# Patient Record
Sex: Female | Born: 2000
Health system: Southern US, Community
[De-identification: ages and names within clinical notes are randomized; demographics above are authoritative.]

## PROBLEM LIST (undated history)

## (undated) DIAGNOSIS — T4145XA Adverse effect of unspecified anesthetic, initial encounter: Secondary | ICD-10-CM

## (undated) DIAGNOSIS — T8859XA Other complications of anesthesia, initial encounter: Secondary | ICD-10-CM

## (undated) DIAGNOSIS — H905 Unspecified sensorineural hearing loss: Secondary | ICD-10-CM

## (undated) HISTORY — PX: WISDOM TOOTH EXTRACTION: SHX21

---
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
## 2021-03-14 ENCOUNTER — Ambulatory Visit (INDEPENDENT_AMBULATORY_CARE_PROVIDER_SITE_OTHER): Payer: 59 | Admitting: Adult Health

## 2021-03-14 ENCOUNTER — Other Ambulatory Visit: Payer: Self-pay

## 2021-03-14 ENCOUNTER — Encounter: Payer: Self-pay | Admitting: Adult Health

## 2021-03-14 VITALS — BP 110/78 | HR 85 | Temp 97.7°F | Ht 64.25 in | Wt 147.2 lb

## 2021-03-14 DIAGNOSIS — Z1389 Encounter for screening for other disorder: Secondary | ICD-10-CM

## 2021-03-14 DIAGNOSIS — Z0000 Encounter for general adult medical examination without abnormal findings: Secondary | ICD-10-CM | POA: Diagnosis not present

## 2021-03-14 DIAGNOSIS — Z6824 Body mass index (BMI) 24.0-24.9, adult: Secondary | ICD-10-CM

## 2021-03-14 DIAGNOSIS — G43009 Migraine without aura, not intractable, without status migrainosus: Secondary | ICD-10-CM

## 2021-03-14 DIAGNOSIS — Z13228 Encounter for screening for other metabolic disorders: Secondary | ICD-10-CM

## 2021-03-14 DIAGNOSIS — K581 Irritable bowel syndrome with constipation: Secondary | ICD-10-CM

## 2021-03-14 DIAGNOSIS — D509 Iron deficiency anemia, unspecified: Secondary | ICD-10-CM

## 2021-03-14 DIAGNOSIS — E559 Vitamin D deficiency, unspecified: Secondary | ICD-10-CM

## 2021-03-14 DIAGNOSIS — E538 Deficiency of other specified B group vitamins: Secondary | ICD-10-CM

## 2021-03-14 NOTE — Progress Notes (Signed)
Complete Physical  Assessment and Plan:  Roberta Zavala was seen today for annual exam.  Diagnoses and all orders for this visit:  Encounter for routine adult health examination without abnormal findings Health Maintenance- Discussed STD testing, safe sex, alcohol and drug awareness, drinking and driving dangers, wearing a seat belt and general safety measures for young adult.  Migraines Currently well managed with imitrex/zofran - magnesium  Papilledema Resolved per ophth/neuro;  Continue follow up for monitoring as recommended  Medication management -     CBC with Differential/Platelet -     COMPLETE METABOLIC PANEL WITH GFR -     Urinalysis, Routine w reflex microscopic  Screening for thyroid disorder -     TSH  Acne, unspecified acne type Recently improved with HBC; hygiene discussed  Restart topicals -adapalene/clindamycin if needed  Anemia Check CBC, iron panel/ferritin, now on multivitamin with iron  IBS -C Improved on linzess and soluble fiber supplement   Need for influenza vaccine Declines - states will get at pharmacy this year   Orders Placed This Encounter  Procedures   CBC with Differential/Platelet   COMPLETE METABOLIC PANEL WITH GFR   Magnesium   VITAMIN D 25 Hydroxy (Vit-D Deficiency, Fractures)   Iron, TIBC and Ferritin Panel   Urinalysis, Routine w reflex microscopic    Discussed med's effects and SE's. Screening labs and tests as requested with regular follow-up as recommended. Over 40 minutes of exam, counseling, chart review and critical decision making was performed  Future Appointments  Date Time Provider [place]  03/14/2022  3:00 PM Liane Comber, NP [facility] None    HPI  BP 110/78    Pulse 85    Temp 97.7 F (36.5 C)    Ht 5' 4.25" (1.632 m)    Wt 147 lb 3.2 oz (66.8 kg)    SpO2 99%    BMI 25.07 kg/m   This very nice 20 y.o.female presents accompanied by her mother for complete physical.  She has Iron deficiency anemia;  Migraine without aura and without status migrainosus, not intractable; and IBS (irritable colon syndrome) - constipation on their problem list.   Patient reports no complaints at this time.   She is a Administrator, arts at [employer], Roberta Zavala at [employer], [employer], doing well, on Christmas break.   No boyfriend, broke up in 2019, never sexually active.   She is on ortho tricyclen for hormonal acne and has helped significantly; has done differen gel and clindamycin topical which have also helped significantly in the past but hasn't needed.   She was found to have papilledema by optometrist in 10/2018 and was referred to Dr. Wyline Copas, ped neuro; Work-up of MRI/MRV was unremarkable and f/u opthomology visit was reassuring with no papilledema present. He feels her headaches likely represent a mixed picture of tension headaches with less frequent migraine headaches. She has been prescribed zofran and imitrex to take with onset of migraines. She reports has worked well, less migraines, one every 1-2 months.   She had constipation, family history of colon cancer and was referred to [facility] per her preference last year, dx with IBS-C, now on linzess 145 mg daily and doing well.   BMI is Body mass index is 25.07 kg/m., she has been working on diet and exercise, she is vegetarian x 2 year, morning star for protein, beans, nuts.  Admits less active in college, but does walk a lot on campus, has added some AM yoga She drinks water only, drinks 32 oz x 2-3,  she drinks 1 cup of coffee if that.  Snacks a couple times a week.   Wt Readings from Last 3 Encounters:  03/14/21 147 lb 3.2 oz (66.8 kg) (78 %, Z= 0.76)*  10/12/20 154 lb (69.9 kg) (84 %, Z= 1.00)*  03/14/20 145 lb (65.8 kg) (78 %, Z= 0.78)*   * Growth percentiles are based on CDC (Girls, 2-20 Years) data.   Today their BP is BP: 110/78  She does workout. She denies chest pain, shortness of breath, dizziness.  Patient is on Vitamin D supplement,  taking multivitamin   Lab Results  Component Value Date   VD25OH 34 03/04/2019      Lab Results  Component Value Date   WBC 6.7 03/14/2020   HGB 12.3 03/14/2020   HCT 37.5 03/14/2020   MCV 91.5 03/14/2020   PLT 225 03/14/2020   Hx of iron def, taking multivitamin with iron Lab Results  Component Value Date   IRON 36 03/14/2020   TIBC 442 03/14/2020   FERRITIN 6 03/14/2020   She is vegetarian, low end normal B12 last year, was doing drops but not recently  Lab Results  Component Value Date   VITAMINB12 413 03/14/2020      Current Medications:  Current Outpatient Medications on File Prior to Visit  Medication Sig Dispense Refill   ASPIRIN 81 PO Take 81 mg by mouth.     linaclotide (LINZESS) 145 MCG CAPS capsule Take 145 mcg by mouth daily before breakfast.     Multiple Vitamin (MULTIVITAMIN) tablet Take 1 tablet by mouth daily.     Norgestimate-Ethinyl Estradiol Triphasic (TRI-SPRINTEC) 0.18/0.215/0.25 MG-35 MCG tablet Take one tablet daily 84 tablet 1   Ondansetron 4 MG FILM Place 1 film on your tongue at the onset of nausea 10 each 5   SUMAtriptan (IMITREX) 25 MG tablet Take 1 tab with onset of migraine symptoms. May repeat in 2 hours if headache persists or recurs. 10 tablet 0   No current facility-administered medications on file prior to visit.   Health Maintenance:   Immunization History  Administered Date(s) Administered   DTaP 09/07/2001, 11/13/2001, 12/29/2001, 01/06/2003, 07/10/2005   HPV 9-valent 06/09/2019, 08/09/2019, 12/10/2019   Hepatitis A 10/11/2008, 11/07/2009   Hepatitis A, Adult 10/11/2008, 11/07/2009   Hepatitis B May [birth year], 2001, 07/31/2001, 04/05/2002   Hepatitis B, adult 2001/04/04, 07/31/2001, 04/05/2002   HiB (PRP-OMP) 09/07/2001, 11/13/2001, 12/29/2001, 09/23/2002   IPV 09/07/2001, 11/13/2001, 06/25/2002, 07/10/2005   Influenza Inj Mdck Quad With Preservative 03/04/2019, 03/14/2020   Influenza,inj,Quad PF,6+ Mos 01/25/2018    Influenza-Unspecified 01/19/2016, 02/06/2017, 02/02/2018, 03/04/2019   MMR 09/23/2002, 07/10/2005   Meningococcal Conjugate 04/09/2013   PFIZER Comirnaty(Gray Top)Covid-19 Tri-Sucrose Vaccine 05/09/2020, 05/30/2020   Pneumococcal Conjugate-13 12/29/2001   Pneumococcal-Unspecified 09/07/2001, 11/13/2001   Tdap 03/20/2012   Varicella 06/25/2002, 07/10/2005    TD/TDAP: 2013 Influenza: last 2019, declines today states will get at pharmacy  Pneumovax: 2003 Prevnar 13: 2003 HPV vaccines: 3/3, 2021 Covid 19: 2/2, Spring 2022, has had 2 natural infection   LMP: No LMP recorded. Sexually Active: no STD testing decline Pap: start age 59 MGM: -  Vision: Dr. Truman Hayward, last visit 2022, wears contacts/glasses Dental: 2022, goes bianually Derm: ?, Spring 2019  Allergies: No Known Allergies Medical History:  has Iron deficiency anemia; Migraine without aura and without status migrainosus, not intractable; and IBS (irritable colon syndrome) - constipation on their problem list. Surgical History:  She  has a past surgical history that includes Wisdom tooth extraction; Adenoidectomy (N/A, 07/02/2016);  and Tympanoplasty with graft (Right, 07/02/2016). Family History:  Her family history includes Asthma in her paternal grandmother; Colon cancer (age of onset: 2) in her maternal grandfather; Crohn's disease in her mother; Dementia in her maternal grandfather; Diabetes in her maternal grandfather; Fibromyalgia in her paternal grandmother; Heart disease in her paternal grandfather; Hypertension in her maternal grandfather, maternal grandmother, mother, and paternal grandmother; Hypothyroidism in her maternal grandmother; Kidney disease in her maternal grandmother; Melanoma (age of onset: 76) in her paternal grandfather; Peripheral Artery Disease in her maternal grandmother; Stroke (age of onset: 50) in her maternal grandfather. Social History:   reports that she has never smoked. She has never used smokeless  tobacco. She reports current alcohol use. She reports that she does not currently use drugs after having used the following drugs: Marijuana.  Review of Systems: Review of Systems  Constitutional:  Negative for malaise/fatigue and weight loss.  HENT:  Negative for hearing loss and tinnitus.   Eyes:  Negative for blurred vision and double vision.  Respiratory:  Negative for cough, sputum production, shortness of breath and wheezing.   Cardiovascular:  Negative for chest pain, palpitations, orthopnea, claudication, leg swelling and PND.  Gastrointestinal:  Negative for abdominal pain, blood in stool, constipation (improved on linzess), diarrhea, heartburn, melena, nausea and vomiting.  Genitourinary: Negative.   Musculoskeletal:  Negative for joint pain and myalgias.  Skin:  Negative for rash.  Neurological:  Positive for headaches (migraines every few months ). Negative for dizziness, tingling, sensory change and weakness.  Endo/Heme/Allergies:  Positive for environmental allergies (mild). Negative for polydipsia.  Psychiatric/Behavioral: Negative.  Negative for depression, memory loss, substance abuse and suicidal ideas. The patient is not nervous/anxious and does not have insomnia.   All other systems reviewed and are negative.  Physical Exam: Estimated body mass index is 25.07 kg/m as calculated from the following:   Height as of this encounter: 5' 4.25" (1.632 m).   Weight as of this encounter: 147 lb 3.2 oz (66.8 kg). BP 110/78    Pulse 85    Temp 97.7 F (36.5 C)    Ht 5' 4.25" (1.632 m)    Wt 147 lb 3.2 oz (66.8 kg)    SpO2 99%    BMI 25.07 kg/m    General Appearance: Well nourished, in no apparent distress.  Eyes: PERRLA, EOMs, conjunctiva no swelling or erythema Sinuses: No Frontal/maxillary tenderness  ENT/Mouth: Ext aud canals clear, normal light reflex with TMs without erythema, bulging. Good dentition. No erythema, swelling, or exudate on post pharynx. Tonsils not swollen or  erythematous. Hearing normal.  Neck: Supple, thyroid normal. No bruits  Respiratory: Respiratory effort normal, BS equal bilaterally without rales, rhonchi, wheezing or stridor.  Cardio: RRR without murmurs, rubs or gallops. Brisk peripheral pulses without edema.  Chest: symmetric, with normal excursions and percussion.  Breasts: Declines, she reports regular self exams without concern, technique reviewed Abdomen: Soft, nontender, no guarding, rebound, hernias, masses, or organomegaly.  Lymphatics: Non tender without lymphadenopathy.  Genitourinary: Defer Musculoskeletal: Full ROM all peripheral extremities,5/5 strength, and normal gait.  Skin: Warm, dry without rashes, lesions, ecchymosis. Neuro: Cranial nerves intact, reflexes equal bilaterally. Normal muscle tone, no cerebellar symptoms. Sensation intact.  Psych: Awake and oriented X 3, normal affect, Insight and Judgment appropriate.   EKG: defer  Izora Ribas, NP 3:38 PM [facility]

## 2021-03-14 NOTE — Patient Instructions (Signed)
[name] , Thank you for taking time to come for your Annual Wellness Visit. I appreciate your ongoing commitment to your health goals. Please review the following plan we discussed and let me know if I can assist you in the future.   These are the goals we discussed:  Goals      Exercise 150 min/wk Moderate Activity     water intake - 65+ fluid ounces daily        This is a list of the screening recommended for you and due dates:  Health Maintenance  Topic Date Due   Hepatitis C Screening: USPSTF Recommendation to screen - Ages 18-79 yo.  03/14/2021*   HIV Screening  03/14/2021*   COVID-19 Vaccine (3 - Pfizer risk series) 03/30/2021*   Flu Shot  06/29/2021*   Pneumococcal Vaccination (1 - PPSV23 if available, else PCV20) 03/14/2022*   Tetanus Vaccine  03/20/2022   HPV Vaccine  Completed  *Topic was postponed. The date shown is not the original due date.      Know what a healthy weight is for you (roughly BMI <25) and aim to maintain this  Aim for 7+ servings of fruits and vegetables daily  65-80+ fluid ounces of water or unsweet tea for healthy kidneys  Limit to max 1 drink of alcohol per day; avoid smoking/tobacco  Limit animal fats in diet for cholesterol and heart health - choose grass fed whenever available  Avoid highly processed foods, and foods high in saturated/trans fats  Aim for low stress - take time to unwind and care for your mental health  Aim for 150 min of moderate intensity exercise weekly for heart health, and weights twice weekly for bone health  Aim for 7-9 hours of sleep daily     High-Fiber Eating Plan Fiber, also called dietary fiber, is a type of carbohydrate. It is found foods such as fruits, vegetables, whole grains, and beans. A high-fiber diet can have many health benefits. Your health care provider may recommend a high-fiber diet to help: Prevent constipation. Fiber can make your bowel movements more regular. Lower your  cholesterol. Relieve the following conditions: Inflammation of veins in the anus (hemorrhoids). Inflammation of specific areas of the digestive tract (uncomplicated diverticulosis). A problem of the large intestine, also called the colon, that sometimes causes pain and diarrhea (irritable bowel syndrome, or IBS). Prevent overeating as part of a weight-loss plan. Prevent heart disease, type 2 diabetes, and certain cancers. What are tips for following this plan? Reading food labels  Check the nutrition facts label on food products for the amount of dietary fiber. Choose foods that have 5 grams of fiber or more per serving. The goals for recommended daily fiber intake include: Men (age 74 or younger): 34-38 g. Men (over age 89): 28-34 g. Women (age 43 or younger): 25-28 g. Women (over age 40): 22-25 g. Your daily fiber goal is _____________ g. Shopping Choose whole fruits and vegetables instead of processed forms, such as apple juice or applesauce. Choose a wide variety of high-fiber foods such as avocados, lentils, oats, and kidney beans. Read the nutrition facts label of the foods you choose. Be aware of foods with added fiber. These foods often have high sugar and sodium amounts per serving. Cooking Use whole-grain flour for baking and cooking. Cook with brown rice instead of white rice. Meal planning Start the day with a breakfast that is high in fiber, such as a cereal that contains 5 g of fiber or  more per serving. Eat breads and cereals that are made with whole-grain flour instead of refined flour or white flour. Eat brown rice, bulgur wheat, or millet instead of white rice. Use beans in place of meat in soups, salads, and pasta dishes. Be sure that half of the grains you eat each day are whole grains. General information You can get the recommended daily intake of dietary fiber by: Eating a variety of fruits, vegetables, grains, nuts, and beans. Taking a fiber supplement if you  are not able to take in enough fiber in your diet. It is better to get fiber through food than from a supplement. Gradually increase how much fiber you consume. If you increase your intake of dietary fiber too quickly, you may have bloating, cramping, or gas. Drink plenty of water to help you digest fiber. Choose high-fiber snacks, such as berries, raw vegetables, nuts, and popcorn. What foods should I eat? Fruits Berries. Pears. Apples. Oranges. Avocado. Prunes and raisins. Dried figs. Vegetables Sweet potatoes. Spinach. Kale. Artichokes. Cabbage. Broccoli. Cauliflower. Green peas. Carrots. Squash. Grains Whole-grain breads. Multigrain cereal. Oats and oatmeal. Brown rice. Barley. Bulgur wheat. Millet. Quinoa. Bran muffins. Popcorn. Rye wafer crackers. Meats and other proteins Navy beans, kidney beans, and pinto beans. Soybeans. Split peas. Lentils. Nuts and seeds. Dairy Fiber-fortified yogurt. Beverages Fiber-fortified soy milk. Fiber-fortified orange juice. Other foods Fiber bars. The items listed above may not be a complete list of recommended foods and beverages. Contact a dietitian for more information. What foods should I avoid? Fruits Fruit juice. Cooked, strained fruit. Vegetables Fried potatoes. Canned vegetables. Well-cooked vegetables. Grains White bread. Pasta made with refined flour. White rice. Meats and other proteins Fatty cuts of meat. Fried chicken or fried fish. Dairy Milk. Yogurt. Cream cheese. Sour cream. Fats and oils Butters. Beverages Soft drinks. Other foods Cakes and pastries. The items listed above may not be a complete list of foods and beverages to avoid. Talk with your dietitian about what choices are best for you. Summary Fiber is a type of carbohydrate. It is found in foods such as fruits, vegetables, whole grains, and beans. A high-fiber diet has many benefits. It can help to prevent constipation, lower blood cholesterol, aid weight loss, and  reduce your risk of heart disease, diabetes, and certain cancers. Increase your intake of fiber gradually. Increasing fiber too quickly may cause cramping, bloating, and gas. Drink plenty of water while you increase the amount of fiber you consume. The best sources of fiber include whole fruits and vegetables, whole grains, nuts, seeds, and beans. This information is not intended to replace advice given to you by your health care provider. Make sure you discuss any questions you have with your health care provider. Document Revised: 07/22/2019 Document Reviewed: 07/22/2019 Elsevier Patient Education  2022 [employer].

## 2021-03-15 LAB — CBC WITH DIFFERENTIAL/PLATELET
Absolute Monocytes: 660 cells/uL (ref 200–950)
Basophils Absolute: 27 cells/uL (ref 0–200)
Basophils Relative: 0.4 %
Eosinophils Absolute: 102 cells/uL (ref 15–500)
Eosinophils Relative: 1.5 %
HCT: 36.9 % (ref 35.0–45.0)
Hemoglobin: 12.4 g/dL (ref 11.7–15.5)
Lymphs Abs: 2156 cells/uL (ref 850–3900)
MCH: 31.3 pg (ref 27.0–33.0)
MCHC: 33.6 g/dL (ref 32.0–36.0)
MCV: 93.2 fL (ref 80.0–100.0)
MPV: 12.2 fL (ref 7.5–12.5)
Monocytes Relative: 9.7 %
Neutro Abs: 3856 cells/uL (ref 1500–7800)
Neutrophils Relative %: 56.7 %
Platelets: 255 10*3/uL (ref 140–400)
RBC: 3.96 10*6/uL (ref 3.80–5.10)
RDW: 11.7 % (ref 11.0–15.0)
Total Lymphocyte: 31.7 %
WBC: 6.8 10*3/uL (ref 3.8–10.8)

## 2021-03-15 LAB — COMPLETE METABOLIC PANEL WITH GFR
AG Ratio: 1.3 (calc) (ref 1.0–2.5)
ALT: 10 U/L (ref 5–32)
AST: 13 U/L (ref 12–32)
Albumin: 4.5 g/dL (ref 3.6–5.1)
Alkaline phosphatase (APISO): 41 U/L (ref 36–128)
BUN: 7 mg/dL (ref 7–20)
CO2: 26 mmol/L (ref 20–32)
Calcium: 9.9 mg/dL (ref 8.9–10.4)
Chloride: 105 mmol/L (ref 98–110)
Creat: 0.76 mg/dL (ref 0.50–0.96)
Globulin: 3.4 g/dL (calc) (ref 2.0–3.8)
Glucose, Bld: 75 mg/dL (ref 65–99)
Potassium: 4.6 mmol/L (ref 3.8–5.1)
Sodium: 140 mmol/L (ref 135–146)
Total Bilirubin: 0.3 mg/dL (ref 0.2–1.1)
Total Protein: 7.9 g/dL (ref 6.3–8.2)
eGFR: 116 mL/min/{1.73_m2} (ref >=60)

## 2021-03-15 LAB — URINALYSIS, ROUTINE W REFLEX MICROSCOPIC
Bacteria, UA: NONE SEEN /HPF
Bilirubin Urine: NEGATIVE
Glucose, UA: NEGATIVE
Hgb urine dipstick: NEGATIVE
Hyaline Cast: NONE SEEN /LPF
Ketones, ur: NEGATIVE
Nitrite: NEGATIVE
Protein, ur: NEGATIVE
RBC / HPF: NONE SEEN /HPF (ref 0–2)
Specific Gravity, Urine: 1.006 (ref 1.001–1.035)
Squamous Epithelial / HPF: NONE SEEN /HPF (ref <=5)
pH: 6 (ref 5.0–8.0)

## 2021-03-15 LAB — IRON,TIBC AND FERRITIN PANEL
%SAT: 15 % (calc) (ref 15–45)
Ferritin: 7 ng/mL — ABNORMAL LOW (ref 16–154)
Iron: 85 ug/dL (ref 27–164)
TIBC: 555 mcg/dL (calc) — ABNORMAL HIGH (ref 271–448)

## 2021-03-15 LAB — VITAMIN D 25 HYDROXY (VIT D DEFICIENCY, FRACTURES): Vit D, 25-Hydroxy: 44 ng/mL (ref 30–100)

## 2021-03-15 LAB — MAGNESIUM: Magnesium: 2.1 mg/dL (ref 1.5–2.5)

## 2021-03-21 ENCOUNTER — Other Ambulatory Visit: Payer: Self-pay

## 2021-03-21 DIAGNOSIS — G43009 Migraine without aura, not intractable, without status migrainosus: Secondary | ICD-10-CM

## 2021-03-21 MED ORDER — SUMATRIPTAN SUCCINATE 25 MG PO TABS: ORAL_TABLET | ORAL | 0 refills | Status: DC

## 2021-04-12 ENCOUNTER — Other Ambulatory Visit: Payer: Self-pay | Admitting: Adult Health

## 2021-04-12 DIAGNOSIS — Z30011 Encounter for initial prescription of contraceptive pills: Secondary | ICD-10-CM

## 2021-04-12 DIAGNOSIS — L709 Acne, unspecified: Secondary | ICD-10-CM

## 2021-10-10 ENCOUNTER — Telehealth: Payer: Self-pay | Admitting: Adult Health

## 2021-10-10 DIAGNOSIS — R112 Nausea with vomiting, unspecified: Secondary | ICD-10-CM

## 2021-10-10 DIAGNOSIS — G43009 Migraine without aura, not intractable, without status migrainosus: Secondary | ICD-10-CM

## 2021-10-10 NOTE — Telephone Encounter (Signed)
Pt is requesting a refill on Sumatriptin and Ondansetron to go to [facility] in [place]

## 2021-10-11 ENCOUNTER — Other Ambulatory Visit: Payer: Self-pay

## 2021-10-11 DIAGNOSIS — R112 Nausea with vomiting, unspecified: Secondary | ICD-10-CM

## 2021-10-11 MED ORDER — ONDANSETRON 4 MG PO FILM: ORAL_FILM | ORAL | 5 refills | Status: DC

## 2021-10-11 MED ORDER — ONDANSETRON HCL 4 MG PO TABS: 4.0000 mg | ORAL_TABLET | Freq: Every day | ORAL | 1 refills | Status: AC | PRN

## 2021-10-11 MED ORDER — SUMATRIPTAN SUCCINATE 25 MG PO TABS: ORAL_TABLET | ORAL | 0 refills | Status: AC

## 2021-10-11 NOTE — Addendum Note (Signed)
Addended by: Dionicio Stall on: 10/11/2021 08:46 AM   Modules accepted: Orders

## 2021-10-25 ENCOUNTER — Ambulatory Visit (INDEPENDENT_AMBULATORY_CARE_PROVIDER_SITE_OTHER): Payer: 59 | Admitting: Nurse Practitioner

## 2021-10-25 VITALS — BP 110/76 | HR 80 | Temp 97.5°F | Ht 65.0 in | Wt 145.0 lb

## 2021-10-25 DIAGNOSIS — F410 Panic disorder [episodic paroxysmal anxiety] without agoraphobia: Secondary | ICD-10-CM

## 2021-10-25 DIAGNOSIS — F4389 Other reactions to severe stress: Secondary | ICD-10-CM | POA: Diagnosis not present

## 2021-10-25 DIAGNOSIS — Z79899 Other long term (current) drug therapy: Secondary | ICD-10-CM | POA: Diagnosis not present

## 2021-10-25 DIAGNOSIS — F411 Generalized anxiety disorder: Secondary | ICD-10-CM | POA: Diagnosis not present

## 2021-10-25 NOTE — Progress Notes (Signed)
Assessment and Plan:  Roberta Zavala was seen today for an episodic visit.  Diagnoses and all order for this visit:  1. Generalized anxiety disorder Start Propranolol as needed. Reviewed relaxation techniques.  Sleep hygiene. Recommended Cognitive Behavioral Therapy (CBT)-continue to seek out. Recommended mindfulness meditation and exercise.   Insight-oriented psychotherapy given for 16 minutes exclusively. Psychoeducation:  encouraged personality growth wand development through coping techniques and problem-solving skills. Limit/Decrease/Monitor drug/alcohol intake.    2. Panic Propranolol as needed. Declines further medication intervention at this time Journal, note panic/increase in anxiety during menstruation/hormonal fluctuation. Stay well hydrated. Continue to monitor  3. Other reactions to severe stress Discussed grounding. Talk with friends and family  4. Medication management All medications discussed and reviewed in full. All questions and concerns regarding medications addressed.    Notify office for further evaluation and treatment, questions or concerns if s/s fail to improve. The risks and benefits of my recommendations, as well as other treatment options were discussed with the patient today. Questions were answered.  Further disposition pending results of labs. Discussed med's effects and SE's.    Over 20 minutes of exam, counseling, chart review, and critical decision making was performed.   Future Appointments  Date Time Provider Department Center  03/14/2022  3:00 PM Adela Glimpse, NP [facility] None    ------------------------------------------------------------------------------------------------------------------   HPI BP 110/76   Pulse 80   Temp (!) 97.5 F (36.4 C)   Ht 5\' 5"  (1.651 m)   Wt 145 lb (65.8 kg)   SpO2 99%   BMI 24.13 kg/m    Roberta Zavala is a 21 y.o. female who presents for follow up of anxiety disorder and panic attacks. She  has the following anxiety symptoms: chest pain, difficulty concentrating, feelings of losing control, panic attacks, and racing thoughts. Onset of symptoms was approximately 1 week ago. Symptoms have been gradually worsening since that time. She denies current suicidal and homicidal ideation. Family history significant for anxiety and depression. Risk factors:  increase stress in school, stress in the home . Previous treatment includes  Hydroxyzine.  She has seen a counselor in the past, in school but no longer continues. . She complains of the following medication side effects: drowsiness.   Past Medical History:  Diagnosis Date   Complication of anesthesia    mother states pt. needed more anesthesia than usual for someone her age with wisdom teeth extraction   Papilledema of both eyes 11/25/2018   Sensorineural hearing loss of right ear 05/2016     No Known Allergies  Current Outpatient Medications on File Prior to Visit  Medication Sig   hydrOXYzine (VISTARIL) 25 MG capsule Take 25 mg by mouth as needed.   linaclotide (LINZESS) 145 MCG CAPS capsule Take 145 mcg by mouth daily before breakfast.   Multiple Vitamin (MULTIVITAMIN) tablet Take 1 tablet by mouth daily.   Norgestimate-Ethinyl Estradiol Triphasic (TRI-SPRINTEC) 0.18/0.215/0.25 MG-35 MCG tablet Take 1 tablet by mouth once daily   ondansetron (ZOFRAN) 4 MG tablet Take 1 tablet (4 mg total) by mouth daily as needed for nausea or vomiting.   SUMAtriptan (IMITREX) 25 MG tablet Take 1 tab with onset of migraine symptoms. May repeat in 2 hours if headache persists or recurs.   ASPIRIN 81 PO Take 81 mg by mouth. (Patient not taking: Reported on 10/25/2021)   No current facility-administered medications on file prior to visit.    ROS: all negative except what is noted in the HPI.     Physical Exam:  BP 110/76   Pulse 80   Temp (!) 97.5 F (36.4 C)   Ht 5\' 5"  (1.651 m)   Wt 145 lb (65.8 kg)   SpO2 99%   BMI 24.13 kg/m    General Appearance: NAD.  Awake, conversant and cooperative. Eyes: PERRLA, EOMs intact.  Sclera white.  Conjunctiva without erythema. Sinuses: No frontal/maxillary tenderness.  No nasal discharge. Nares patent.  ENT/Mouth: Ext aud canals clear.  Bilateral TMs w/DOL and without erythema or bulging. Hearing intact.  Posterior pharynx without swelling or exudate.  Tonsils without swelling or erythema.  Neck: Supple.  No masses, nodules or thyromegaly. Respiratory: Effort is regular with non-labored breathing. Breath sounds are equal bilaterally without rales, rhonchi, wheezing or stridor.  Cardio: RRR with no MRGs. Brisk peripheral pulses without edema.  Abdomen: Active BS in all four quadrants.  Soft and non-tender without guarding, rebound tenderness, hernias or masses. Lymphatics: Non tender without lymphadenopathy.  Musculoskeletal: Full ROM, 5/5 strength, normal ambulation.  No clubbing or cyanosis. Skin: Appropriate color for ethnicity. Warm without rashes, lesions, ecchymosis, ulcers.  Neuro: CN II-XII grossly normal. Normal muscle tone without cerebellar symptoms and intact sensation.   Psych: AO X 3,  appropriate mood and affect, insight and judgment.     , NP 3:59 PM [facility]

## 2021-10-26 ENCOUNTER — Encounter: Payer: Self-pay | Admitting: Nurse Practitioner

## 2021-10-29 MED ORDER — PROPRANOLOL HCL 10 MG PO TABS: ORAL_TABLET | ORAL | 2 refills | Status: AC

## 2022-02-20 ENCOUNTER — Encounter: Payer: Self-pay | Admitting: Nurse Practitioner

## 2022-02-20 ENCOUNTER — Ambulatory Visit (INDEPENDENT_AMBULATORY_CARE_PROVIDER_SITE_OTHER): Payer: 59 | Admitting: Nurse Practitioner

## 2022-02-20 VITALS — BP 120/70 | HR 104 | Temp 98.3°F | Resp 16 | Ht 65.0 in | Wt 150.0 lb

## 2022-02-20 DIAGNOSIS — F410 Panic disorder [episodic paroxysmal anxiety] without agoraphobia: Secondary | ICD-10-CM | POA: Diagnosis not present

## 2022-02-20 DIAGNOSIS — F411 Generalized anxiety disorder: Secondary | ICD-10-CM | POA: Diagnosis not present

## 2022-02-20 DIAGNOSIS — F4389 Other reactions to severe stress: Secondary | ICD-10-CM | POA: Diagnosis not present

## 2022-02-20 DIAGNOSIS — Z79899 Other long term (current) drug therapy: Secondary | ICD-10-CM

## 2022-02-20 NOTE — Progress Notes (Signed)
Assessment and Plan:  Roberta Zavala was seen today for an episodic visit.  Diagnoses and all order for this visit:  1. Generalized anxiety disorder Continue Propranolol as needed. Reviewed relaxation techniques.  Sleep hygiene. Recommended Cognitive Behavioral Therapy (CBT)-continue to seek out. Recommended mindfulness meditation and exercise.   Insight-oriented psychotherapy given for 16 minutes exclusively. Psychoeducation:  encouraged personality growth wand development through coping techniques and problem-solving skills. Limit/Decrease/Monitor drug/alcohol intake.    2.  Panic Continue Propranolol as needed. Declines further medication intervention at this time Journal, note panic/increase in anxiety during menstruation/hormonal fluctuation. Stay well hydrated. Continue to monitor  3. Other reactions to severe stress Improved. Discussed grounding. Talk with friends and family  4. Medication management All medications discussed and reviewed in full. All questions and concerns regarding medications addressed.    Notify office for further evaluation and treatment, questions or concerns if s/s fail to improve. The risks and benefits of my recommendations, as well as other treatment options were discussed with the patient today. Questions were answered.  Further disposition pending results of labs. Discussed med's effects and SE's.    Over 20 minutes of exam, counseling, chart review, and critical decision making was performed.   Future Appointments  Date Time Provider [place]  03/14/2022  3:00 PM Amarie Tarte, Kenney Houseman, NP [facility] None    ------------------------------------------------------------------------------------------------------------------   HPI BP 120/70   Pulse (!) 104   Temp 98.3 F (36.8 C)   Resp 16   Ht 5\' 5"  (1.651 m)   Wt 150 lb (68 kg)   SpO2 99%   BMI 24.96 kg/m    Roberta Zavala is a 21 y.o. female who presents for follow up of anxiety  disorder and panic attacks. She has the following anxiety symptoms: chest pain, difficulty concentrating, feelings of losing control, panic attacks, and racing thoughts. She was treated with Propranolol and Hydroxyzine.  She has not needed the Propranolol and feels better taking the Hydroxyzine at night before bed when feeling anxious.  States that staying busy with school has helped to ease her mind.  She denies current suicidal and homicidal ideation. Family history significant for anxiety and depression. Risk factors:  increase stress in school, stress in the home . Previous treatment includes  Hydroxyzine.  She has seen a counselor in the past, in school but no longer continues. . She complains of the following medication side effects: drowsiness.   Past Medical History:  Diagnosis Date   Complication of anesthesia    mother states pt. needed more anesthesia than usual for someone her age with wisdom teeth extraction   Papilledema of both eyes 11/25/2018   Sensorineural hearing loss of right ear 05/2016     No Known Allergies  Current Outpatient Medications on File Prior to Visit  Medication Sig   hydrOXYzine (VISTARIL) 25 MG capsule Take 25 mg by mouth as needed.   linaclotide (LINZESS) 145 MCG CAPS capsule Take 145 mcg by mouth daily before breakfast.   Multiple Vitamin (MULTIVITAMIN) tablet Take 1 tablet by mouth daily.   Norgestimate-Ethinyl Estradiol Triphasic (TRI-SPRINTEC) 0.18/0.215/0.25 MG-35 MCG tablet Take 1 tablet by mouth once daily   ondansetron (ZOFRAN) 4 MG tablet Take 1 tablet (4 mg total) by mouth daily as needed for nausea or vomiting.   propranolol (INDERAL) 10 MG tablet Take 1 - 2 tablets (10 - 20 mg) 30 minutes prior to an anxiety provoking event.   SUMAtriptan (IMITREX) 25 MG tablet Take 1 tab with onset of migraine symptoms.  May repeat in 2 hours if headache persists or recurs.   ASPIRIN 81 PO Take 81 mg by mouth. (Patient not taking: Reported on 10/25/2021)   No  current facility-administered medications on file prior to visit.    ROS: all negative except what is noted in the HPI.     Physical Exam:  BP 120/70   Pulse (!) 104   Temp 98.3 F (36.8 C)   Resp 16   Ht 5\' 5"  (1.651 m)   Wt 150 lb (68 kg)   SpO2 99%   BMI 24.96 kg/m   General Appearance: NAD.  Awake, conversant and cooperative. Eyes: PERRLA, EOMs intact.  Sclera white.  Conjunctiva without erythema. Sinuses: No frontal/maxillary tenderness.  No nasal discharge. Nares patent.  ENT/Mouth: Ext aud canals clear.  Bilateral TMs w/DOL and without erythema or bulging. Hearing intact.  Posterior pharynx without swelling or exudate.  Tonsils without swelling or erythema.  Neck: Supple.  No masses, nodules or thyromegaly. Respiratory: Effort is regular with non-labored breathing. Breath sounds are equal bilaterally without rales, rhonchi, wheezing or stridor.  Cardio: RRR with no MRGs. Brisk peripheral pulses without edema.  Abdomen: Active BS in all four quadrants.  Soft and non-tender without guarding, rebound tenderness, hernias or masses. Lymphatics: Non tender without lymphadenopathy.  Musculoskeletal: Full ROM, 5/5 strength, normal ambulation.  No clubbing or cyanosis. Skin: Appropriate color for ethnicity. Warm without rashes, lesions, ecchymosis, ulcers.  Neuro: CN II-XII grossly normal. Normal muscle tone without cerebellar symptoms and intact sensation.   Psych: AO X 3,  appropriate mood and affect, insight and judgment.     , NP 4:33 PM [facility]

## 2022-03-13 ENCOUNTER — Other Ambulatory Visit: Payer: Self-pay

## 2022-03-13 DIAGNOSIS — Z30011 Encounter for initial prescription of contraceptive pills: Secondary | ICD-10-CM

## 2022-03-13 DIAGNOSIS — L709 Acne, unspecified: Secondary | ICD-10-CM

## 2022-03-13 MED ORDER — NORGESTIM-ETH ESTRAD TRIPHASIC 0.18/0.215/0.25 MG-35 MCG PO TABS: ORAL_TABLET | ORAL | 3 refills | Status: DC

## 2022-03-14 ENCOUNTER — Encounter: Payer: Self-pay | Admitting: Nurse Practitioner

## 2022-03-14 ENCOUNTER — Ambulatory Visit (INDEPENDENT_AMBULATORY_CARE_PROVIDER_SITE_OTHER): Payer: 59 | Admitting: Nurse Practitioner

## 2022-03-14 VITALS — BP 96/70 | HR 96 | Temp 97.1°F | Ht 65.5 in | Wt 150.4 lb

## 2022-03-14 DIAGNOSIS — Z79899 Other long term (current) drug therapy: Secondary | ICD-10-CM

## 2022-03-14 DIAGNOSIS — Z13220 Encounter for screening for lipoid disorders: Secondary | ICD-10-CM

## 2022-03-14 DIAGNOSIS — G43009 Migraine without aura, not intractable, without status migrainosus: Secondary | ICD-10-CM

## 2022-03-14 DIAGNOSIS — Z1329 Encounter for screening for other suspected endocrine disorder: Secondary | ICD-10-CM

## 2022-03-14 DIAGNOSIS — L738 Other specified follicular disorders: Secondary | ICD-10-CM

## 2022-03-14 DIAGNOSIS — Z1389 Encounter for screening for other disorder: Secondary | ICD-10-CM

## 2022-03-14 DIAGNOSIS — K581 Irritable bowel syndrome with constipation: Secondary | ICD-10-CM

## 2022-03-14 DIAGNOSIS — D509 Iron deficiency anemia, unspecified: Secondary | ICD-10-CM

## 2022-03-14 DIAGNOSIS — L709 Acne, unspecified: Secondary | ICD-10-CM

## 2022-03-14 DIAGNOSIS — Z13228 Encounter for screening for other metabolic disorders: Secondary | ICD-10-CM

## 2022-03-14 DIAGNOSIS — Z23 Encounter for immunization: Secondary | ICD-10-CM

## 2022-03-14 DIAGNOSIS — D649 Anemia, unspecified: Secondary | ICD-10-CM

## 2022-03-14 DIAGNOSIS — F411 Generalized anxiety disorder: Secondary | ICD-10-CM

## 2022-03-14 DIAGNOSIS — Z0001 Encounter for general adult medical examination with abnormal findings: Secondary | ICD-10-CM

## 2022-03-14 DIAGNOSIS — E559 Vitamin D deficiency, unspecified: Secondary | ICD-10-CM

## 2022-03-14 DIAGNOSIS — Z0000 Encounter for general adult medical examination without abnormal findings: Secondary | ICD-10-CM | POA: Diagnosis not present

## 2022-03-14 DIAGNOSIS — R229 Localized swelling, mass and lump, unspecified: Secondary | ICD-10-CM

## 2022-03-14 NOTE — Progress Notes (Signed)
Complete Physical  Assessment and Plan:  Roberta Zavala was seen today for annual exam.  Diagnoses and all orders for this visit:  Encounter for routine adult health examination without abnormal findings Health Maintenance- Discussed STD testing, safe sex, alcohol and drug awareness, drinking and driving dangers, wearing a seat belt and general safety measures for young adult.  Migraines Currently well managed with imitrex/zofran Stay well hydrated  Medication management All medications discussed and reviewed in full. All questions and concerns regarding medications addressed.     Screening for thyroid disorder -  TSH  Acne, unspecified acne type/sebaceous hyperplasia Recently improved with HBC; hygiene discussed  Restart topicals -adapalene/clindamycin if needed Dermatology referral for evaluation of sebaceous hyperplasia  Anemia Check CBC, iron panel/ferritin, now on multivitamin with iron  IBS -C Improved on linzess and soluble fiber supplement  Stay well hydrated  Need for influenza vaccine Declines - states will get at pharmacy this year  Right cheek nodule Will have Dermatology review. If s/s fail to improve discussed further imaging.  GAD Continue Propranolol.   Reviewed relaxation techniques.  Sleep hygiene. Recommended mindfulness meditation and exercise.   Insight-oriented psychotherapy given for 16 minutes exclusively. Psychoeducation:  encouraged personality growth wand development through coping techniques and problem-solving skills. Limit/Decrease/Monitor drug/alcohol intake.    Screening for hematuria or proteinuria  Check and monitor UA  Screening for metabolic disorder Check L3Y  Vitamin D Deficiency Monitor levels  Screening for cholesterol levels Check lipids  Orders Placed This Encounter  Procedures   Flu Vaccine QUAD 36+ mos IM (Fluarix, Fluzone & Afluria Quad PF   CBC with Differential/Platelet   COMPLETE METABOLIC PANEL WITH GFR   Iron,  TIBC and Ferritin Panel   Lipid panel   TSH   Hemoglobin A1c   VITAMIN D 25 Hydroxy (Vit-D Deficiency, Fractures)   Urinalysis, Routine w reflex microscopic   Microalbumin / creatinine urine ratio     Notify office for further evaluation and treatment, questions or concerns if any reported s/s fail to improve.   The patient was advised to call back or seek an in-person evaluation if any symptoms worsen or if the condition fails to improve as anticipated.   Further disposition pending results of labs. Discussed med's effects and SE's.    I discussed the assessment and treatment plan with the patient. The patient was provided an opportunity to ask questions and all were answered. The patient agreed with the plan and demonstrated an understanding of the instructions.  Discussed med's effects and SE's. Screening labs and tests as requested with regular follow-up as recommended.  I provided 35 minutes of face-to-face time during this encounter including counseling, chart review, and critical decision making was preformed.   Future Appointments  Date Time Provider [place]  03/17/2023  3:00 PM Darrol Jump, NP [facility] None    HPI  BP 96/70   Pulse 96   Temp (!) 97.1 F (36.2 C)   Ht 5' 5.5" (1.664 m)   Wt 150 lb 6.4 oz (68.2 kg)   SpO2 96%   BMI 24.65 kg/m   This very nice 21 y.o.female presents for complete physical.  She has Iron deficiency anemia; Migraine without aura and without status migrainosus, not intractable; and IBS (irritable colon syndrome) - constipation on their problem list.  She is not currently sexually active.  Her LMP is 03/14/22.  States that she will be going to [place] 08/2022 for two months for an intership via school.  She is attending [employer]  [employer] and studying social work.   Patient reports concern for a right cheek nodule that has been present for the last several months.  She feels as though the nodule is slowly growing and  moveable.   She denies any other symptoms to skin or inside of mouth.  No recent injury.    She is on ortho tricyclen for hormonal acne and has helped significantly; has done differen gel and clindamycin topical which have also helped significantly in the past but hasn't needed. She is concerned for an area on her left cheek that has not healed.  She was found to have papilledema by optometrist in 10/2018 and was referred to Dr. Wyline Copas, ped neuro; Work-up of MRI/MRV was unremarkable and f/u opthomology visit was reassuring with no papilledema present. He feels her headaches likely represent a mixed picture of tension headaches with less frequent migraine headaches. She has been prescribed zofran and imitrex to take with onset of migraines. She reports has worked well, less migraines, one every 1-2 months.   She had constipation, family history of colon cancer and was referred to [facility] per her preference last year, dx with IBS-C, now on linzess 145 mg daily and doing well.   BMI is Body mass index is 24.65 kg/m., she has been working on diet and exercise.  Vegetarian x 2 years. Wt Readings from Last 3 Encounters:  03/14/22 150 lb 6.4 oz (68.2 kg)  02/20/22 150 lb (68 kg)  10/25/21 145 lb (65.8 kg)   Today their BP is BP: 96/70  She does workout. She denies chest pain, shortness of breath, dizziness.  Patient is on Vitamin D supplement, taking multivitamin   Lab Results  Component Value Date   VD25OH 44 03/14/2021      Lab Results  Component Value Date   WBC 6.8 03/14/2021   HGB 12.4 03/14/2021   HCT 36.9 03/14/2021   MCV 93.2 03/14/2021   PLT 255 03/14/2021   Hx of iron def, taking multivitamin with iron Lab Results  Component Value Date   IRON 85 03/14/2021   TIBC 555 (H) 03/14/2021   FERRITIN 7 (L) 03/14/2021   She is vegetarian, low end normal B12 last year, was doing drops but not recently  Lab Results  Component Value Date   [id number] 387 03/14/2020      Current  Medications:  Current Outpatient Medications on File Prior to Visit  Medication Sig Dispense Refill   ASPIRIN 81 PO Take 81 mg by mouth.     hydrOXYzine (VISTARIL) 25 MG capsule Take 25 mg by mouth as needed.     linaclotide (LINZESS) 145 MCG CAPS capsule Take 145 mcg by mouth daily before breakfast.     Multiple Vitamin (MULTIVITAMIN) tablet Take 1 tablet by mouth daily.     Norgestimate-Ethinyl Estradiol Triphasic (TRI-SPRINTEC) 0.18/0.215/0.25 MG-35 MCG tablet Take 1 tablet by mouth once daily 84 tablet 3   ondansetron (ZOFRAN) 4 MG tablet Take 1 tablet (4 mg total) by mouth daily as needed for nausea or vomiting. 30 tablet 1   propranolol (INDERAL) 10 MG tablet Take 1 - 2 tablets (10 - 20 mg) 30 minutes prior to an anxiety provoking event. 30 tablet 2   SUMAtriptan (IMITREX) 25 MG tablet Take 1 tab with onset of migraine symptoms. May repeat in 2 hours if headache persists or recurs. 10 tablet 0   No current facility-administered medications on file prior to visit.   Health Maintenance:   Immunization History  Administered Date(s) Administered   DTaP 09/07/2001, 11/13/2001, 12/29/2001, 01/06/2003, 07/10/2005   HIB (PRP-OMP) 09/07/2001, 11/13/2001, 12/29/2001, 09/23/2002   HPV 9-valent 06/09/2019, 08/09/2019, 12/10/2019   Hepatitis A 10/11/2008, 11/07/2009   Hepatitis A, Adult 10/11/2008, 11/07/2009   Hepatitis B January 17, 2002, 07/31/2001, 04/05/2002   Hepatitis B, adult 08/12/01, 07/31/2001, 04/05/2002   IPV 09/07/2001, 11/13/2001, 06/25/2002, 07/10/2005   Influenza Inj Mdck Quad With Preservative 03/04/2019, 03/14/2020   Influenza,inj,Quad PF,6+ Mos 01/25/2018, 03/14/2022   Influenza-Unspecified 01/19/2016, 02/06/2017, 02/02/2018, 03/04/2019   MMR 09/23/2002, 07/10/2005   Meningococcal Conjugate 04/09/2013   PFIZER Comirnaty(Gray Top)Covid-19 Tri-Sucrose Vaccine 05/09/2020, 05/30/2020   Pneumococcal Conjugate-13 12/29/2001   Pneumococcal-Unspecified 09/07/2001, 11/13/2001   Tdap  03/20/2012   Varicella 06/25/2002, 07/10/2005    TD/TDAP: 2013 Influenza: Administered today 12/2021 Pneumovax: 2003 Prevnar 13: 2003 HPV vaccines: 3/3, 2021 Covid 19: 2/2, Spring 2022, has had 2 natural infection   LMP: 03/14/22 Sexually Active: no STD testing decline Pap: start age 14 MGM: -  Vision: Dr. Truman Hayward, last visit 2023, wears contacts/glasses Dental: 2023, goes bianually Derm: Spring 2019 - request new referral.  Allergies: No Known Allergies Medical History:  has Iron deficiency anemia; Migraine without aura and without status migrainosus, not intractable; and IBS (irritable colon syndrome) - constipation on their problem list. Surgical History:  She  has a past surgical history that includes Wisdom tooth extraction; Adenoidectomy (N/A, 07/02/2016); and Tympanoplasty with graft (Right, 07/02/2016). Family History:  Her family history includes Asthma in her paternal grandmother; Colon cancer (age of onset: 4) in her maternal grandfather; Crohn's disease in her mother; Dementia in her maternal grandfather; Diabetes in her maternal grandfather; Fibromyalgia in her paternal grandmother; Heart disease in her paternal grandfather; Hypertension in her maternal grandfather, maternal grandmother, mother, and paternal grandmother; Hypothyroidism in her maternal grandmother; Kidney disease in her maternal grandmother; Melanoma (age of onset: 65) in her paternal grandfather; Peripheral Artery Disease in her maternal grandmother; Stroke (age of onset: 64) in her maternal grandfather. Social History:   reports that she has never smoked. She has never used smokeless tobacco. She reports current alcohol use. She reports that she does not currently use drugs after having used the following drugs: Marijuana.  Review of Systems: Review of Systems  Constitutional:  Negative for malaise/fatigue and weight loss.  HENT:  Negative for hearing loss and tinnitus.   Eyes:  Negative for blurred vision  and double vision.  Respiratory:  Negative for cough, sputum production, shortness of breath and wheezing.   Cardiovascular:  Negative for chest pain, palpitations, orthopnea, claudication, leg swelling and PND.  Gastrointestinal:  Negative for abdominal pain, blood in stool, constipation (improved on linzess), diarrhea, heartburn, melena, nausea and vomiting.  Genitourinary: Negative.   Musculoskeletal:  Negative for joint pain and myalgias.  Skin:  Negative for rash.  Neurological:  Positive for headaches (migraines every few months ). Negative for dizziness, tingling, sensory change and weakness.  Endo/Heme/Allergies:  Positive for environmental allergies (mild). Negative for polydipsia.  Psychiatric/Behavioral: Negative.  Negative for depression, memory loss, substance abuse and suicidal ideas. The patient is not nervous/anxious and does not have insomnia.   All other systems reviewed and are negative.   Physical Exam: Estimated body mass index is 24.65 kg/m as calculated from the following:   Height as of this encounter: 5' 5.5" (1.664 m).   Weight as of this encounter: 150 lb 6.4 oz (68.2 kg). BP 96/70   Pulse 96   Temp (!) 97.1 F (36.2 C)   Ht  5' 5.5" (1.664 m)   Wt 150 lb 6.4 oz (68.2 kg)   SpO2 96%   BMI 24.65 kg/m    General Appearance: Well nourished, in no apparent distress.  Eyes: PERRLA, EOMs, conjunctiva no swelling or erythema Sinuses: No Frontal/maxillary tenderness  ENT/Mouth: Ext aud canals clear, normal light reflex with TMs without erythema, bulging. Good dentition. Right cheek with moveable No erythema, swelling, or exudate on post pharynx. Tonsils not swollen or erythematous. Hearing normal.  Neck: Supple, thyroid normal. No bruits  Respiratory: Respiratory effort normal, BS equal bilaterally without rales, rhonchi, wheezing or stridor.  Cardio: RRR without murmurs, rubs or gallops. Brisk peripheral pulses without edema.  Chest: symmetric, with normal  excursions and percussion.  Breasts: Declines, she reports regular self exams without concern, technique reviewed Abdomen: Soft, nontender, no guarding, rebound, hernias, masses, or organomegaly.  Lymphatics: Non tender without lymphadenopathy.  Genitourinary: Defer Musculoskeletal: Full ROM all peripheral extremities,5/5 strength, and normal gait.  Skin: Sebaceous hyperplasia just above the left nasal ala.  Right mid-cheek groove just central to the nasolabial and malar area with a palpable 2 cm round hard moveable nodule.  Flat, not raised above the skin.  Non painful.  Surrounding area warm, dry without rashes, lesions, ecchymosis. Neuro: Cranial nerves intact, reflexes equal bilaterally. Normal muscle tone, no cerebellar symptoms. Sensation intact.  Psych: Awake and oriented X 3, normal affect, Insight and Judgment appropriate.   EKG: defer  Darrol Jump, NP 10:07 PM [facility]

## 2022-03-14 NOTE — Patient Instructions (Addendum)
If you have any issues call our office and we will set this up for you.   Healthy Eating Following a healthy eating pattern may help you to achieve and maintain a healthy body weight, reduce the risk of chronic disease, and live a long and productive life. It is important to follow a healthy eating pattern at an appropriate calorie level for your body. Your nutritional needs should be met primarily through food by choosing a variety of nutrient-rich foods. What are tips for following this plan? Reading food labels Read labels and choose the following: Reduced or low sodium. Juices with 100% fruit juice. Foods with low saturated fats and high polyunsaturated and monounsaturated fats. Foods with whole grains, such as whole wheat, cracked wheat, brown rice, and wild rice. Whole grains that are fortified with folic acid. This is recommended for women who are pregnant or who want to become pregnant. Read labels and avoid the following: Foods with a lot of added sugars. These include foods that contain brown sugar, corn sweetener, corn syrup, dextrose, fructose, glucose, high-fructose corn syrup, honey, invert sugar, lactose, malt syrup, maltose, molasses, raw sugar, sucrose, trehalose, or turbinado sugar. Do not eat more than the following amounts of added sugar per day: 6 teaspoons (25 g) for women. 9 teaspoons (38 g) for men. Foods that contain processed or refined starches and grains. Refined grain products, such as white flour, degermed cornmeal, white bread, and white rice. Shopping Choose nutrient-rich snacks, such as vegetables, whole fruits, and nuts. Avoid high-calorie and high-sugar snacks, such as potato chips, fruit snacks, and candy. Use oil-based dressings and spreads on foods instead of solid fats such as butter, stick margarine, or cream cheese. Limit pre-made sauces, mixes, and "instant" products such as flavored rice, instant noodles, and ready-made pasta. Try more plant-protein  sources, such as tofu, tempeh, black beans, edamame, lentils, nuts, and seeds. Explore eating plans such as the Mediterranean diet or vegetarian diet. Cooking Use oil to saut or stir-fry foods instead of solid fats such as butter, stick margarine, or lard. Try baking, boiling, grilling, or broiling instead of frying. Remove the fatty part of meats before cooking. Steam vegetables in water or broth. Meal planning  At meals, imagine dividing your plate into fourths: One-half of your plate is fruits and vegetables. One-fourth of your plate is whole grains. One-fourth of your plate is protein, especially lean meats, poultry, eggs, tofu, beans, or nuts. Include low-fat dairy as part of your daily diet. Lifestyle Choose healthy options in all settings, including home, work, school, restaurants, or stores. Prepare your food safely: Wash your hands after handling raw meats. Keep food preparation surfaces clean by regularly washing with hot, soapy water. Keep raw meats separate from ready-to-eat foods, such as fruits and vegetables. Cook seafood, meat, poultry, and eggs to the recommended internal temperature. Store foods at safe temperatures. In general: Keep cold foods at 70F (4.4C) or below. Keep hot foods at 170F (60C) or above. Keep your freezer at [facility] (-17.8C) or below. Foods are no longer safe to eat when they have been between the temperatures of 40-170F (4.4-60C) for more than 2 hours. What foods should I eat? Fruits Aim to eat 2 cup-equivalents of fresh, canned (in natural juice), or frozen fruits each day. Examples of 1 cup-equivalent of fruit include 1 small apple, 8 large strawberries, 1 cup canned fruit,  cup dried fruit, or 1 cup 100% juice. Vegetables Aim to eat 2-3 cup-equivalents of fresh and frozen vegetables each day,  including different varieties and colors. Examples of 1 cup-equivalent of vegetables include 2 medium carrots, 2 cups raw, leafy greens, 1 cup  chopped vegetable (raw or cooked), or 1 medium baked potato. Grains Aim to eat 6 ounce-equivalents of whole grains each day. Examples of 1 ounce-equivalent of grains include 1 slice of bread, 1 cup ready-to-eat cereal, 3 cups popcorn, or  cup cooked rice, pasta, or cereal. Meats and other proteins Aim to eat 5-6 ounce-equivalents of protein each day. Examples of 1 ounce-equivalent of protein include 1 egg, 1/2 cup nuts or seeds, or 1 tablespoon (16 g) peanut butter. A cut of meat or fish that is the size of a deck of cards is about 3-4 ounce-equivalents. Of the protein you eat each week, try to have at least 8 ounces come from seafood. This includes salmon, trout, herring, and anchovies. Dairy Aim to eat 3 cup-equivalents of fat-free or low-fat dairy each day. Examples of 1 cup-equivalent of dairy include 1 cup (240 mL) milk, 8 ounces (250 g) yogurt, 1 ounces (44 g) natural cheese, or 1 cup (240 mL) fortified soy milk. Fats and oils Aim for about 5 teaspoons (21 g) per day. Choose monounsaturated fats, such as canola and olive oils, avocados, peanut butter, and most nuts, or polyunsaturated fats, such as sunflower, corn, and soybean oils, walnuts, pine nuts, sesame seeds, sunflower seeds, and flaxseed. Beverages Aim for six 8-oz glasses of water per day. Limit coffee to three to five 8-oz cups per day. Limit caffeinated beverages that have added calories, such as soda and energy drinks. Limit alcohol intake to no more than 1 drink a day for nonpregnant women and 2 drinks a day for men. One drink equals 12 oz of beer (355 mL), 5 oz of wine (148 mL), or 1 oz of hard liquor (44 mL). Seasoning and other foods Avoid adding excess amounts of salt to your foods. Try flavoring foods with herbs and spices instead of salt. Avoid adding sugar to foods. Try using oil-based dressings, sauces, and spreads instead of solid fats. This information is based on general [place] nutrition guidelines. For more  information, visit [employer]. Exact amounts may vary based on your nutrition needs. Summary A healthy eating plan may help you to maintain a healthy weight, reduce the risk of chronic diseases, and stay active throughout your life. Plan your meals. Make sure you eat the right portions of a variety of nutrient-rich foods. Try baking, boiling, grilling, or broiling instead of frying. Choose healthy options in all settings, including home, work, school, restaurants, or stores. This information is not intended to replace advice given to you by your health care provider. Make sure you discuss any questions you have with your health care provider. Document Revised: 08/29/2021 Document Reviewed: 11/14/2020 Elsevier Patient Education  [employer].

## 2022-03-15 LAB — URINALYSIS, ROUTINE W REFLEX MICROSCOPIC
Bilirubin Urine: NEGATIVE
Glucose, UA: NEGATIVE
Hyaline Cast: NONE SEEN /LPF
Ketones, ur: NEGATIVE
Nitrite: NEGATIVE
Protein, ur: NEGATIVE
Specific Gravity, Urine: 1.006 (ref 1.001–1.035)
Squamous Epithelial / HPF: NONE SEEN /HPF (ref <=5)
pH: 6 (ref 5.0–8.0)

## 2022-03-15 LAB — COMPLETE METABOLIC PANEL WITH GFR
AG Ratio: 1.3 (calc) (ref 1.0–2.5)
ALT: 11 U/L (ref 6–29)
AST: 14 U/L (ref 10–30)
Albumin: 4.2 g/dL (ref 3.6–5.1)
Alkaline phosphatase (APISO): 39 U/L (ref 31–125)
BUN: 10 mg/dL (ref 7–25)
CO2: 28 mmol/L (ref 20–32)
Calcium: 9.7 mg/dL (ref 8.6–10.2)
Chloride: 105 mmol/L (ref 98–110)
Creat: 0.66 mg/dL (ref 0.50–0.96)
Globulin: 3.2 g/dL (calc) (ref 1.9–3.7)
Glucose, Bld: 100 mg/dL — ABNORMAL HIGH (ref 65–99)
Potassium: 4.5 mmol/L (ref 3.5–5.3)
Sodium: 141 mmol/L (ref 135–146)
Total Bilirubin: 0.2 mg/dL (ref 0.2–1.2)
Total Protein: 7.4 g/dL (ref 6.1–8.1)
eGFR: 129 mL/min/{1.73_m2} (ref >=60)

## 2022-03-15 LAB — CBC WITH DIFFERENTIAL/PLATELET
Absolute Monocytes: 573 cells/uL (ref 200–950)
Basophils Absolute: 32 cells/uL (ref 0–200)
Basophils Relative: 0.5 %
Eosinophils Absolute: 202 cells/uL (ref 15–500)
Eosinophils Relative: 3.2 %
HCT: 35.7 % (ref 35.0–45.0)
Hemoglobin: 12.3 g/dL (ref 11.7–15.5)
Lymphs Abs: 2356 cells/uL (ref 850–3900)
MCH: 31.8 pg (ref 27.0–33.0)
MCHC: 34.5 g/dL (ref 32.0–36.0)
MCV: 92.2 fL (ref 80.0–100.0)
MPV: 12.4 fL (ref 7.5–12.5)
Monocytes Relative: 9.1 %
Neutro Abs: 3137 cells/uL (ref 1500–7800)
Neutrophils Relative %: 49.8 %
Platelets: 210 10*3/uL (ref 140–400)
RBC: 3.87 10*6/uL (ref 3.80–5.10)
RDW: 11.7 % (ref 11.0–15.0)
Total Lymphocyte: 37.4 %
WBC: 6.3 10*3/uL (ref 3.8–10.8)

## 2022-03-15 LAB — IRON,TIBC AND FERRITIN PANEL
%SAT: 26 % (calc) (ref 16–45)
Ferritin: 11 ng/mL — ABNORMAL LOW (ref 16–154)
Iron: 124 ug/dL (ref 40–190)
TIBC: 486 mcg/dL (calc) — ABNORMAL HIGH (ref 250–450)

## 2022-03-15 LAB — LIPID PANEL
Cholesterol: 177 mg/dL (ref <200)
HDL: 57 mg/dL (ref >=50)
LDL Cholesterol (Calc): 97 mg/dL (calc)
Non-HDL Cholesterol (Calc): 120 mg/dL (calc) (ref <130)
Total CHOL/HDL Ratio: 3.1 (calc) (ref <5.0)
Triglycerides: 135 mg/dL (ref <150)

## 2022-03-15 LAB — VITAMIN D 25 HYDROXY (VIT D DEFICIENCY, FRACTURES): Vit D, 25-Hydroxy: 37 ng/mL (ref 30–100)

## 2022-03-15 LAB — MICROALBUMIN / CREATININE URINE RATIO
Creatinine, Urine: 35 mg/dL (ref 20–275)
Microalb Creat Ratio: 14 mcg/mg creat (ref <30)
Microalb, Ur: 0.5 mg/dL

## 2022-03-15 LAB — MICROSCOPIC MESSAGE

## 2022-03-15 LAB — HEMOGLOBIN A1C
Hgb A1c MFr Bld: 5.1 % of total Hgb (ref <5.7)
Mean Plasma Glucose: 100 mg/dL
eAG (mmol/L): 5.5 mmol/L

## 2022-03-15 LAB — TSH: TSH: 1.45 mIU/L

## 2022-05-13 IMAGING — CR DG ABDOMEN 2V
3 series · 3 of 3 positions shown · non-contrast
Comparison: 05/08/2006

CLINICAL DATA: Constipation

EXAM:
ABDOMEN - 2 VIEW

[w abdomen upright]
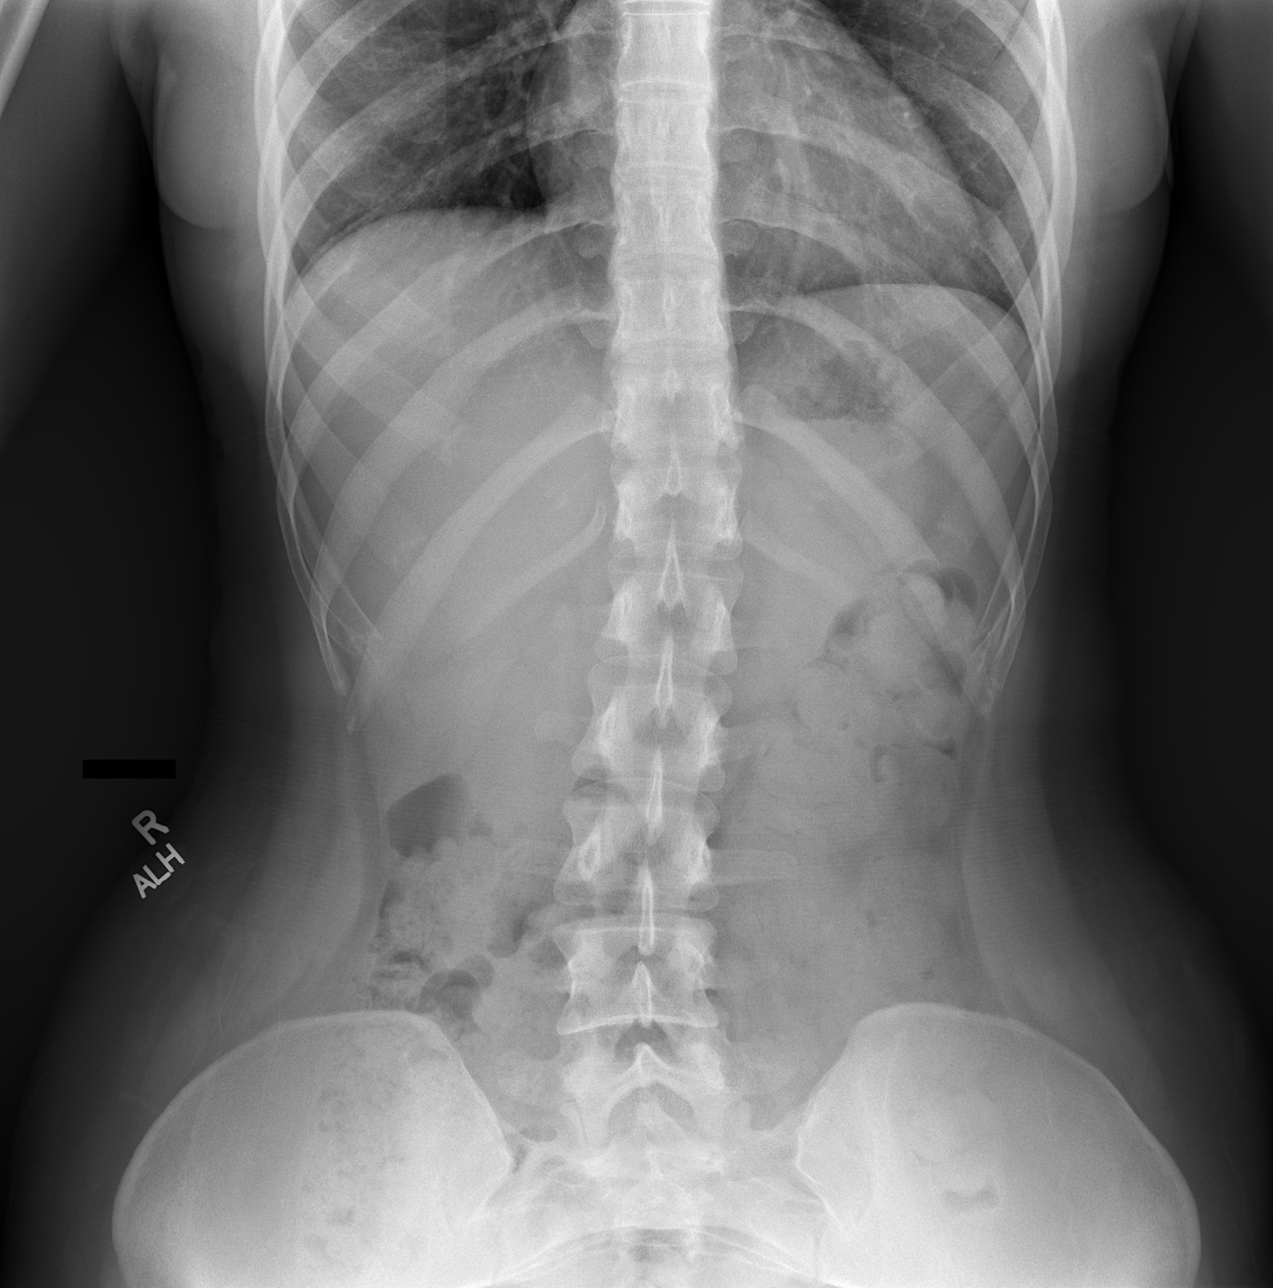

[t abdomen supine (1 of 2)]
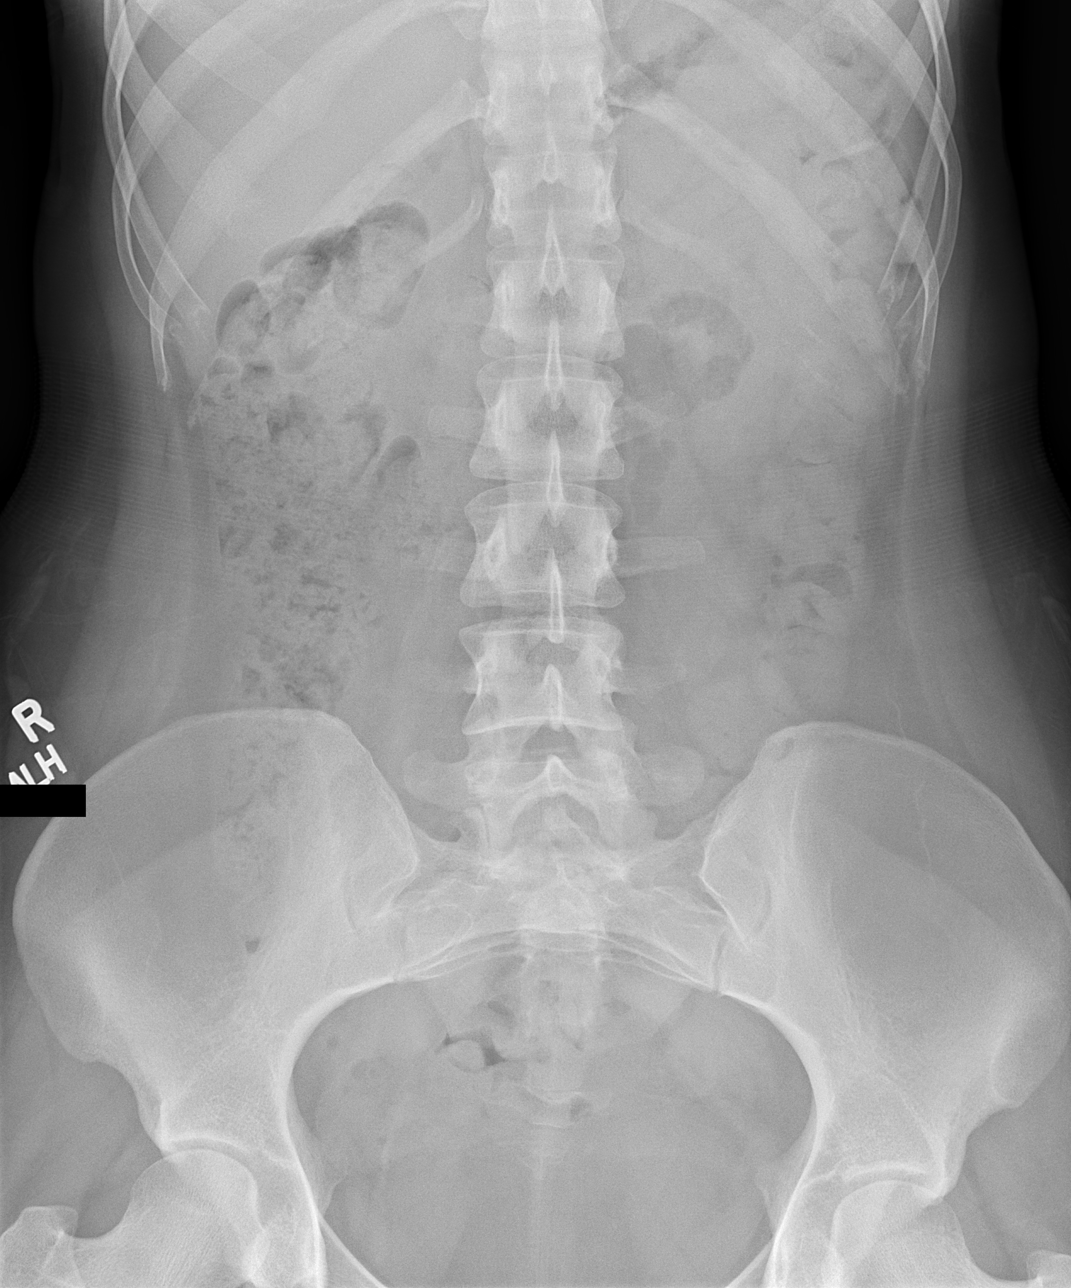

[t abdomen supine (2 of 2)]
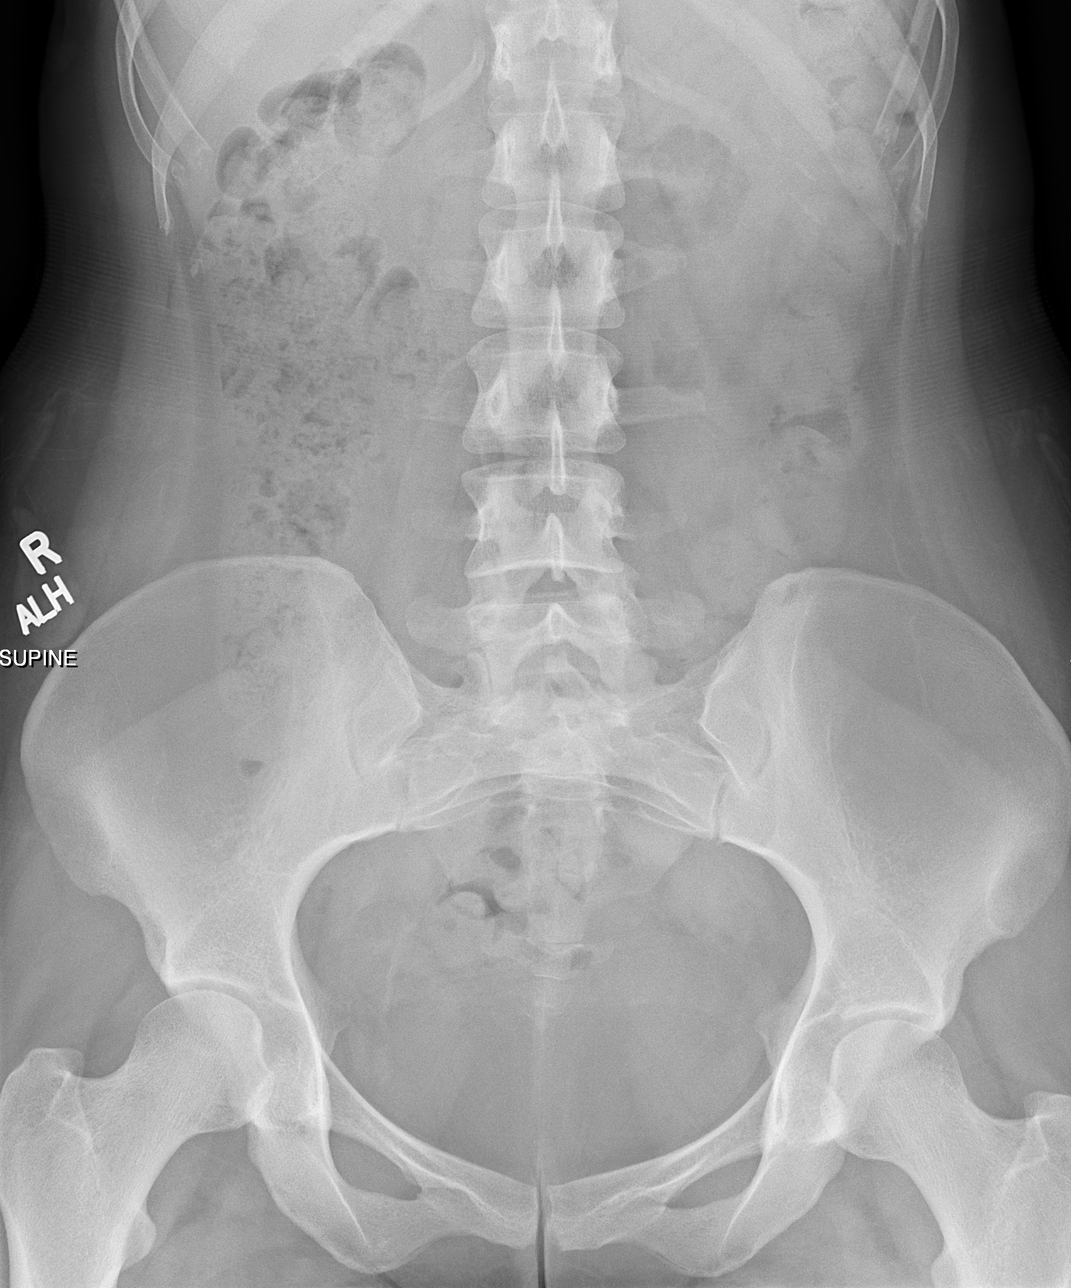

[3 of 3 positions shown; findings below may reference images not displayed]

FINDINGS: No free air beneath the diaphragm. The lung bases are clear.
Nonobstructed gas pattern with moderate to large volume of stool in
the colon. No radiopaque calculi.
IMPRESSION: Nonobstructed gas pattern with moderate to large stool burden

## 2023-02-04 ENCOUNTER — Other Ambulatory Visit: Payer: Self-pay | Admitting: Nurse Practitioner

## 2023-02-04 DIAGNOSIS — L709 Acne, unspecified: Secondary | ICD-10-CM

## 2023-02-04 DIAGNOSIS — Z30011 Encounter for initial prescription of contraceptive pills: Secondary | ICD-10-CM

## 2023-03-17 ENCOUNTER — Encounter: Payer: 59 | Admitting: Nurse Practitioner

## 2023-03-19 ENCOUNTER — Encounter: Payer: Self-pay | Admitting: Nurse Practitioner

## 2023-03-19 ENCOUNTER — Ambulatory Visit (INDEPENDENT_AMBULATORY_CARE_PROVIDER_SITE_OTHER): Payer: 59 | Admitting: Nurse Practitioner

## 2023-03-19 VITALS — BP 116/74 | HR 83 | Temp 97.9°F | Ht 64.75 in | Wt 179.0 lb

## 2023-03-19 DIAGNOSIS — K581 Irritable bowel syndrome with constipation: Secondary | ICD-10-CM

## 2023-03-19 DIAGNOSIS — D1801 Hemangioma of skin and subcutaneous tissue: Secondary | ICD-10-CM

## 2023-03-19 DIAGNOSIS — Z0000 Encounter for general adult medical examination without abnormal findings: Secondary | ICD-10-CM

## 2023-03-19 DIAGNOSIS — Z79899 Other long term (current) drug therapy: Secondary | ICD-10-CM

## 2023-03-19 DIAGNOSIS — Z13220 Encounter for screening for lipoid disorders: Secondary | ICD-10-CM

## 2023-03-19 DIAGNOSIS — Z1329 Encounter for screening for other suspected endocrine disorder: Secondary | ICD-10-CM

## 2023-03-19 DIAGNOSIS — F411 Generalized anxiety disorder: Secondary | ICD-10-CM

## 2023-03-19 DIAGNOSIS — D649 Anemia, unspecified: Secondary | ICD-10-CM

## 2023-03-19 DIAGNOSIS — G43009 Migraine without aura, not intractable, without status migrainosus: Secondary | ICD-10-CM

## 2023-03-19 DIAGNOSIS — Z1389 Encounter for screening for other disorder: Secondary | ICD-10-CM

## 2023-03-19 DIAGNOSIS — E559 Vitamin D deficiency, unspecified: Secondary | ICD-10-CM

## 2023-03-19 DIAGNOSIS — Z131 Encounter for screening for diabetes mellitus: Secondary | ICD-10-CM

## 2023-03-19 DIAGNOSIS — Z0001 Encounter for general adult medical examination with abnormal findings: Secondary | ICD-10-CM

## 2023-03-19 DIAGNOSIS — L709 Acne, unspecified: Secondary | ICD-10-CM

## 2023-03-19 MED ORDER — TRIAMCINOLONE ACETONIDE 0.025 % EX OINT: 1.0000 | TOPICAL_OINTMENT | Freq: Two times a day (BID) | CUTANEOUS | 0 refills | Status: AC

## 2023-03-19 NOTE — Progress Notes (Addendum)
Complete Physical  Assessment and Plan:  Roberta Zavala was seen today for annual exam.  Diagnoses and all orders for this visit:  Encounter for routine adult health examination without abnormal findings Health Maintenance- Discussed STD testing, safe sex, alcohol and drug awareness, drinking and driving dangers, wearing a seat belt and general safety measures for young adult.  Migraines Currently well managed with imitrex/zofran Stay well hydrated  Medication management All medications discussed and reviewed in full. All questions and concerns regarding medications addressed.    Screening for thyroid disorder -  TSH  Acne, unspecified acne type/sebaceous hyperplasia Recently improved with HBC; hygiene discussed  Restart topicals -adapalene/clindamycin if needed Dermatology referral for evaluation of sebaceous hyperplasia  Anemia Check CBC, iron panel/ferritin, now on multivitamin with iron  IBS -C Improved on linzess and soluble fiber supplement  Stay well hydrated  GAD Continue Propranolol.   Reviewed relaxation techniques.  Sleep hygiene. Recommended mindfulness meditation and exercise.   Insight-oriented psychotherapy given for 16 minutes exclusively. Psychoeducation:  encouraged personality growth wand development through coping techniques and problem-solving skills. Limit/Decrease/Monitor drug/alcohol intake.    Screening for hematuria or proteinuria  Check and monitor UA  Screening for metabolic disorder Check A1c  Vitamin D Deficiency Monitor levels  Screening for cholesterol levels Check lipids  Cherry angioma  Apply topical steroid cream as directed Do not use longer than 2 weeks to decrease risk for hypopigmentation RTC to have removed via cautery or cryosurgery if no improvement with topical steroid.   Orders Placed This Encounter  Procedures   CBC with Differential/Platelet   COMPLETE METABOLIC PANEL WITH GFR   Lipid panel   TSH   Hemoglobin A1c    Insulin, random   VITAMIN D 25 Hydroxy (Vit-D Deficiency, Fractures)   Meds ordered this encounter  Medications   triamcinolone (KENALOG) 0.025 % ointment    Sig: Apply 1 Application topically 2 (two) times daily.    Dispense:  30 g    Refill:  0    Supervising Provider:   Lucky Cowboy [phone]   Notify office for further evaluation and treatment, questions or concerns if any reported s/s fail to improve.   The patient was advised to call back or seek an in-person evaluation if any symptoms worsen or if the condition fails to improve as anticipated.   Further disposition pending results of labs. Discussed med's effects and SE's.    I discussed the assessment and treatment plan with the patient. The patient was provided an opportunity to ask questions and all were answered. The patient agreed with the plan and demonstrated an understanding of the instructions.  Discussed med's effects and SE's. Screening labs and tests as requested with regular follow-up as recommended.  I provided 35 minutes of face-to-face time during this encounter including counseling, chart review, and critical decision making was preformed.   Future Appointments  Date Time Provider Department Center  03/29/2024  2:00 PM Adela Glimpse, NP [facility] None    HPI  BP 116/74   Pulse 83   Temp 97.9 F (36.6 C)   Ht 5' 4.75" (1.645 m)   Wt 179 lb (81.2 kg)   SpO2 98%   BMI 30.02 kg/m   Roberta Zavala is a 22 y.o.female who presents for complete a physical.  She has Iron deficiency anemia; Migraine without aura and without status migrainosus, not intractable; and IBS (irritable colon syndrome) - constipation on their problem list.    States that she has an area to the top right  portion of her back that she felt to be a pimple.  She was able to drain, however, continued to bleed.  Has no formed a raised red area that at times can be itchy and fall off but reoccurs as a raised red bump.  States visited [place]  08/2022 for two months for an intership via school.  She is attending [employer] and studying social work.  She is home now for Christmas break.  She is not currently sexually active.  Her LMP is 03/12/23.  She is UTD on Gardasil vaccine.    She is on ortho tricyclen for hormonal acne and has helped significantly; has done differen gel and clindamycin topical which have also helped significantly in the past but hasn't needed. She is concerned for an area on her left cheek that has not healed.  She was found to have papilledema by optometrist in 10/2018 and was referred to Dr. Ellison Carwin, ped neuro; Work-up of MRI/MRV was unremarkable and f/u opthomology visit was reassuring with no papilledema present. He feels her headaches likely represent a mixed picture of tension headaches with less frequent migraine headaches. She has been prescribed zofran and imitrex to take with onset of migraines. She reports has worked well, less migraines, one every 1-2 months.   She had constipation, family history of colon cancer and was referred to [facility] per her preference last year, dx with IBS-C, now on linzess 145 mg daily and doing well.   BMI is Body mass index is 30.02 kg/m., she has been working on diet and exercise.  Vegetarian x 2 years. Wt Readings from Last 3 Encounters:  03/19/23 179 lb (81.2 kg)  03/14/22 150 lb 6.4 oz (68.2 kg)  02/20/22 150 lb (68 kg)   Today their BP is BP: 116/74  She does workout. She denies chest pain, shortness of breath, dizziness.  Patient is on Vitamin D supplement, taking multivitamin   Lab Results  Component Value Date   VD25OH 37 03/14/2022      Lab Results  Component Value Date   WBC 6.3 03/14/2022   HGB 12.3 03/14/2022   HCT 35.7 03/14/2022   MCV 92.2 03/14/2022   PLT 210 03/14/2022   Hx of iron def, taking multivitamin with iron Lab Results  Component Value Date   IRON 124 03/14/2022   TIBC 486 (H) 03/14/2022   FERRITIN 11 (L) 03/14/2022    She is vegetarian, low end normal B12 last year, was doing drops but not recently  Lab Results  Component Value Date   VITAMINB12 413 03/14/2020      Current Medications:  Current Outpatient Medications on File Prior to Visit  Medication Sig Dispense Refill   ASPIRIN 81 PO Take 81 mg by mouth.     escitalopram (LEXAPRO) 10 MG tablet Take 10 mg by mouth daily.     linaclotide (LINZESS) 145 MCG CAPS capsule Take 145 mcg by mouth daily before breakfast.     Multiple Vitamin (MULTIVITAMIN) tablet Take 1 tablet by mouth daily.     Norgestimate-Ethinyl Estradiol Triphasic (TRI-SPRINTEC) 0.18/0.215/0.25 MG-35 MCG tablet Take 1 tablet by mouth once daily 84 tablet 0   propranolol (INDERAL) 10 MG tablet Take 1 - 2 tablets (10 - 20 mg) 30 minutes prior to an anxiety provoking event. 30 tablet 2   SUMAtriptan (IMITREX) 25 MG tablet Take 1 tab with onset of migraine symptoms. May repeat in 2 hours if headache persists or recurs. 10 tablet 0   hydrOXYzine (  VISTARIL) 25 MG capsule Take 25 mg by mouth as needed. (Patient not taking: Reported on 03/19/2023)     No current facility-administered medications on file prior to visit.   Health Maintenance:   Immunization History  Administered Date(s) Administered   DTaP 09/07/2001, 11/13/2001, 12/29/2001, 01/06/2003, 07/10/2005   HIB (PRP-OMP) 09/07/2001, 11/13/2001, 12/29/2001, 09/23/2002   HPV 9-valent 06/09/2019, 08/09/2019, 12/10/2019   Hepatitis A 10/11/2008, 11/07/2009   Hepatitis A, Adult 10/11/2008, 11/07/2009   Hepatitis B 28-Sep-2001, 07/31/2001, 04/05/2002   Hepatitis B, ADULT 2001[birth year]07-16, 07/31/2001, 04/05/2002   IPV 09/07/2001, 11/13/2001, 06/25/2002, 07/10/2005   Influenza Inj Mdck Quad With Preservative 03/04/2019, 03/14/2020   Influenza,inj,Quad PF,6+ Mos 01/25/2018, 03/14/2022   Influenza-Unspecified 01/19/2016, 02/06/2017, 02/02/2018, 03/04/2019, 01/14/2023   MMR 09/23/2002, 07/10/2005   Meningococcal Conjugate 04/09/2013   PFIZER  Comirnaty(Gray Top)Covid-19 Tri-Sucrose Vaccine 05/09/2020, 05/30/2020   Pneumococcal Conjugate-13 12/29/2001   Pneumococcal-Unspecified 09/07/2001, 11/13/2001   Tdap 03/20/2012   Varicella 06/25/2002, 07/10/2005    TD/TDAP: 2013 Influenza: Administered today 03/2022 Pneumovax: 2003 Prevnar 13: 2003 HPV vaccines: 3/3, 2021 Covid 19: 2/2, Spring 2022, has had 2 natural infection   LMP: 03/12/23 Sexually Active: no STD testing decline Pap: start age 25 - defer today - not sexually active, UTD with gardasil vaccine  MGM: N/A  Vision: Dr. Nedra Hai, last visit 2024, wears contacts/glasses Dental: 2024, goes bianually Derm: Spring 2019 - request new referral.  Allergies: No Known Allergies Medical History:  has Iron deficiency anemia; Migraine without aura and without status migrainosus, not intractable; and IBS (irritable colon syndrome) - constipation on their problem list. Surgical History:  She  has a past surgical history that includes Wisdom tooth extraction; Adenoidectomy (N/A, 07/02/2016); and Tympanoplasty with graft (Right, 07/02/2016). Family History:  Her family history includes Asthma in her paternal grandmother; Colon cancer (age of onset: 47) in her maternal grandfather; Crohn's disease in her mother; Dementia in her maternal grandfather; Diabetes in her maternal grandfather; Fibromyalgia in her paternal grandmother; Heart disease in her paternal grandfather; Hypertension in her maternal grandfather, maternal grandmother, mother, and paternal grandmother; Hypothyroidism in her maternal grandmother; Kidney disease in her maternal grandmother; Melanoma (age of onset: 23) in her paternal grandfather; Peripheral Artery Disease in her maternal grandmother; Stroke (age of onset: 59) in her maternal grandfather. Social History:   reports that she has never smoked. She has never used smokeless tobacco. She reports current alcohol use. She reports that she does not currently use drugs after  having used the following drugs: Marijuana.  Review of Systems: Review of Systems  Constitutional:  Negative for malaise/fatigue and weight loss.  HENT:  Negative for hearing loss and tinnitus.   Eyes:  Negative for blurred vision and double vision.  Respiratory:  Negative for cough, sputum production, shortness of breath and wheezing.   Cardiovascular:  Negative for chest pain, palpitations, orthopnea, claudication, leg swelling and PND.  Gastrointestinal:  Negative for abdominal pain, blood in stool, constipation (improved on linzess), diarrhea, heartburn, melena, nausea and vomiting.  Genitourinary: Negative.   Musculoskeletal:  Negative for joint pain and myalgias.  Skin:  Negative for rash.  Neurological:  Positive for headaches (migraines every few months ). Negative for dizziness, tingling, sensory change and weakness.  Endo/Heme/Allergies:  Positive for environmental allergies (mild). Negative for polydipsia.  Psychiatric/Behavioral: Negative.  Negative for depression, memory loss, substance abuse and suicidal ideas. The patient is not nervous/anxious and does not have insomnia.   All other systems reviewed and are negative.   Physical Exam: Estimated  body mass index is 30.02 kg/m as calculated from the following:   Height as of this encounter: 5' 4.75" (1.645 m).   Weight as of this encounter: 179 lb (81.2 kg). BP 116/74   Pulse 83   Temp 97.9 F (36.6 C)   Ht 5' 4.75" (1.645 m)   Wt 179 lb (81.2 kg)   SpO2 98%   BMI 30.02 kg/m    General Appearance: Well nourished, in no apparent distress.  Eyes: PERRLA, EOMs, conjunctiva no swelling or erythema Sinuses: No Frontal/maxillary tenderness  ENT/Mouth: Ext aud canals clear, normal light reflex with TMs without erythema, bulging. Good dentition. Right cheek with moveable No erythema, swelling, or exudate on post pharynx. Tonsils not swollen or erythematous. Hearing normal.  Neck: Supple, thyroid normal. No bruits   Respiratory: Respiratory effort normal, BS equal bilaterally without rales, rhonchi, wheezing or stridor.  Cardio: RRR without murmurs, rubs or gallops. Brisk peripheral pulses without edema.  Chest: symmetric, with normal excursions and percussion.  Breasts: Declines, she reports regular self exams without concern, technique reviewed Abdomen: Soft, nontender, no guarding, rebound, hernias, masses, or organomegaly.  Lymphatics: Non tender without lymphadenopathy.  Genitourinary: Defer Musculoskeletal: Full ROM all peripheral extremities,5/5 strength, and normal gait.  Skin: Right posterior scapula area with 1 cm raised round erythematous bump.  Surrounding area warm, dry without rashes, lesions, ecchymosis. Neuro: Cranial nerves intact, reflexes equal bilaterally. Normal muscle tone, no cerebellar symptoms. Sensation intact.  Psych: Awake and oriented X 3, normal affect, Insight and Judgment appropriate.   EKG: defer  Adela Glimpse, NP 2:45 PM [facility]

## 2023-03-19 NOTE — Addendum Note (Signed)
Addended by: Adela Glimpse on: 03/19/2023 02:53 PM   Modules accepted: Orders

## 2023-03-19 NOTE — Patient Instructions (Signed)
Cherry Angioma A cherry angioma, also called a Ladean Raya spot, is a harmless growth on the skin. It is made up of blood vessels. Cherry angiomas can appear anywhere on the body, but they usually appear on the trunk and arms. What are the causes? The cause of this condition is not known, but it seems to be related to advancing age. What increases the risk? You are more likely to develop this condition if: You are over the age of 34. You have a family member with this condition. What are the signs or symptoms? Symptoms of this condition include harmless growths that are: Smooth, round, and red or purplish-red. As small as the tip of a pin or as big as a pencil eraser. How is this diagnosed? This condition is diagnosed with a skin exam. Rarely, a piece of the cherry angioma may be removed for testing if it is not clear that the growth is a cherry angioma. How is this treated? Treatment is not needed for this condition. If you do not like the way a cherry angioma looks, you may have it removed. Removal methods include: A method where heat is used to burn the cherry angioma off the skin (electrocautery). A method where the cherry angioma is frozen (cryosurgery). This causes it to eventually fall off the skin. A method where a laser is used to destroy the red blood cells and blood vessels in the angioma (laser therapy). A minor surgical procedure. A scalpel is used to remove the cherry angioma off the skin. A cherry angioma may come back after it has been removed. Follow these instructions at home: If you have a cherry angioma removed, keep the area clean and follow any other care instructions as told by your health care provider. Take over-the-counter and prescription medicines only as told by your health care provider. Keep all follow-up visits. This is important. Summary A cherry angioma is a harmless growth on the skin that is made up of blood vessels. Treatment is not needed for this  condition. If you do not like the way a cherry angioma looks, you may have it removed. If you have a cherry angioma removed, follow any care instructions as told by your health care provider. This information is not intended to replace advice given to you by your health care provider. Make sure you discuss any questions you have with your health care provider. Document Revised: 12/07/2020 Document Reviewed: 12/07/2020 Elsevier Patient Education  2024 [employer].

## 2023-03-20 LAB — CBC WITH DIFFERENTIAL/PLATELET
Absolute Lymphocytes: 2220 {cells}/uL (ref 850–3900)
Absolute Monocytes: 598 {cells}/uL (ref 200–950)
Basophils Absolute: 43 {cells}/uL (ref 0–200)
Basophils Relative: 0.7 %
Eosinophils Absolute: 189 {cells}/uL (ref 15–500)
Eosinophils Relative: 3.1 %
HCT: 37.3 % (ref 35.0–45.0)
Hemoglobin: 11.9 g/dL (ref 11.7–15.5)
MCH: 29.5 pg (ref 27.0–33.0)
MCHC: 31.9 g/dL — ABNORMAL LOW (ref 32.0–36.0)
MCV: 92.6 fL (ref 80.0–100.0)
MPV: 12.1 fL (ref 7.5–12.5)
Monocytes Relative: 9.8 %
Neutro Abs: 3050 {cells}/uL (ref 1500–7800)
Neutrophils Relative %: 50 %
Platelets: 259 10*3/uL (ref 140–400)
RBC: 4.03 10*6/uL (ref 3.80–5.10)
RDW: 12 % (ref 11.0–15.0)
Total Lymphocyte: 36.4 %
WBC: 6.1 10*3/uL (ref 3.8–10.8)

## 2023-03-20 LAB — LIPID PANEL
Cholesterol: 197 mg/dL (ref <200)
HDL: 63 mg/dL (ref >=50)
LDL Cholesterol (Calc): 106 mg/dL — ABNORMAL HIGH
Non-HDL Cholesterol (Calc): 134 mg/dL — ABNORMAL HIGH (ref <130)
Total CHOL/HDL Ratio: 3.1 (calc) (ref <5.0)
Triglycerides: 168 mg/dL — ABNORMAL HIGH (ref <150)

## 2023-03-20 LAB — COMPLETE METABOLIC PANEL WITH GFR
AG Ratio: 1.3 (calc) (ref 1.0–2.5)
ALT: 16 U/L (ref 6–29)
AST: 15 U/L (ref 10–30)
Albumin: 4.4 g/dL (ref 3.6–5.1)
Alkaline phosphatase (APISO): 52 U/L (ref 31–125)
BUN: 11 mg/dL (ref 7–25)
CO2: 26 mmol/L (ref 20–32)
Calcium: 9.6 mg/dL (ref 8.6–10.2)
Chloride: 102 mmol/L (ref 98–110)
Creat: 0.7 mg/dL (ref 0.50–0.96)
Globulin: 3.4 g/dL (ref 1.9–3.7)
Glucose, Bld: 76 mg/dL (ref 65–99)
Potassium: 4.3 mmol/L (ref 3.5–5.3)
Sodium: 137 mmol/L (ref 135–146)
Total Bilirubin: 0.3 mg/dL (ref 0.2–1.2)
Total Protein: 7.8 g/dL (ref 6.1–8.1)
eGFR: 126 mL/min/{1.73_m2} (ref >=60)

## 2023-03-20 LAB — HEMOGLOBIN A1C
Hgb A1c MFr Bld: 5.1 %{Hb} (ref <5.7)
Mean Plasma Glucose: 100 mg/dL
eAG (mmol/L): 5.5 mmol/L

## 2023-03-20 LAB — INSULIN, RANDOM: Insulin: 10.1 u[IU]/mL

## 2023-03-20 LAB — TSH: TSH: 1.69 m[IU]/L

## 2023-03-20 LAB — VITAMIN D 25 HYDROXY (VIT D DEFICIENCY, FRACTURES): Vit D, 25-Hydroxy: 33 ng/mL (ref 30–100)

## 2023-05-02 ENCOUNTER — Other Ambulatory Visit: Payer: Self-pay | Admitting: Nurse Practitioner

## 2023-05-02 DIAGNOSIS — Z30011 Encounter for initial prescription of contraceptive pills: Secondary | ICD-10-CM

## 2023-05-02 DIAGNOSIS — L709 Acne, unspecified: Secondary | ICD-10-CM

## 2024-03-29 ENCOUNTER — Encounter: Payer: 59 | Admitting: Nurse Practitioner

## 2024-04-13 ENCOUNTER — Encounter: Payer: 59 | Admitting: Internal Medicine
